# Patient Record
Sex: Female | Born: 2000 | Race: Black or African American | Hispanic: No | Marital: Single | Smoking: Never smoker
Health system: Southern US, Community
[De-identification: ages and names within clinical notes are randomized; demographics above are authoritative.]

## PROBLEM LIST (undated history)

## (undated) DIAGNOSIS — J302 Other seasonal allergic rhinitis: Secondary | ICD-10-CM

---
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
## 2021-06-04 ENCOUNTER — Emergency Department (HOSPITAL_COMMUNITY)
Admission: EM | Admit: 2021-06-04 | Discharge: 2021-06-04 | Disposition: A | Discharge status: Home / Self Care | Payer: Medicaid Other | Attending: Emergency Medicine | Admitting: Emergency Medicine

## 2021-06-04 ENCOUNTER — Encounter (HOSPITAL_COMMUNITY): Payer: Self-pay | Admitting: Emergency Medicine

## 2021-06-04 DIAGNOSIS — H60501 Unspecified acute noninfective otitis externa, right ear: Secondary | ICD-10-CM | POA: Insufficient documentation

## 2021-06-04 DIAGNOSIS — H9201 Otalgia, right ear: Secondary | ICD-10-CM | POA: Diagnosis present

## 2021-06-04 MED ORDER — CIPROFLOXACIN-DEXAMETHASONE 0.3-0.1 % OT SUSP
4.0000 [drp] | Freq: Once | OTIC | Status: AC
  Administered 2021-06-04: 4 [drp] via OTIC
  Filled 2021-06-04: qty 7.5

## 2021-06-04 NOTE — ED Triage Notes (Signed)
Pt reports R sided otalgia for the last few days. States that she has tried home remedies without success.  ?

## 2021-06-04 NOTE — ED Provider Notes (Signed)
?[facility]-EMERGENCY DEPT ?Provider Note ? ? ?CSN: [id number] ?Arrival date & time: 06/04/21  1941 ? ?  ? ?History ? ?Chief Complaint  ?Patient presents with  ? Otalgia  ?  R  ? ? ?Beverly Gutierrez is a 21 y.o. female. ? ?Patient presents to the emergency department for evaluation of right ear pain "for a few days".  Review of records show a history of otitis externa.  Patient states that the pain is located inside the ear.  She denies other associated infectious symptoms such as fevers, runny nose or congestion, cough, sore throat.  She denies putting anything into her ear.  She has used cool compresses and over-the-counter eardrops without improvement.  No drainage.  Hearing is muffled.  She states that she gets water in her ears when she washes her hair and denies other instances where she gets water in her ears. ? ? ?  ? ?Home Medications ?Prior to Admission medications   ?Medication Sig Start Date End Date Taking? Authorizing Provider  ?cetirizine (ZYRTEC ALLERGY) 10 MG tablet Take 1 tablet (10 mg total) by mouth daily. 05/19/20   Particia Nearing, PA-C  ?cetirizine (ZYRTEC) 10 MG tablet Take 10 mg by mouth at bedtime.    [provider]  ?fluticasone (FLONASE) 50 MCG/ACT nasal spray Place 1 spray into both nostrils daily. 05/19/20   Particia Nearing, PA-C  ?ibuprofen (ADVIL,MOTRIN) 100 MG/5ML suspension Take 18.5 mLs (370 mg total) by mouth every 6 (six) hours as needed for mild pain. 02/25/13   Marcellina Millin, MD  ?ibuprofen (CHILDRENS MOTRIN) 100 MG/5ML suspension Take 21.5 mLs (430 mg total) by mouth every 6 (six) hours as needed for fever. 03/06/14   Marcellina Millin, MD  ?ondansetron (ZOFRAN-ODT) 4 MG disintegrating tablet Take 1 tablet (4 mg total) by mouth every 8 (eight) hours as needed for nausea or vomiting. 03/29/14   Niel Hummer, MD  ?Phenylephrine-Bromphen-DM (DIMETAPP DM COLD/COUGH PO) Take 10 mLs by mouth every 6 (six) hours as needed (for cough).    [provider]  ?   ? ?Allergies    ?Patient has no known allergies.   ? ?Review of Systems   ?Review of Systems ? ?Physical Exam ?Updated Vital Signs ?BP 108/73 (BP Location: Left Arm)   Pulse 96   Temp 98.1 ?F (36.7 ?C) (Oral)   Resp 16   SpO2 99%  ?Physical Exam ?Vitals and nursing note reviewed.  ?Constitutional:   ?   Appearance: She is well-developed.  ?HENT:  ?   Head: Normocephalic and atraumatic.  ?   Jaw: No trismus.  ?   Right Ear: Decreased hearing noted. No drainage.  ?   Left Ear: Tympanic membrane, ear canal and external ear normal.  ?   Ears:  ?   Comments: Right ear canal is inflamed with significant debris and swelling of the canal.  TM is occluded.  Mild pain with pulling on the pinna. ? ?Left ear canal and TM are normal. ?   Nose: Nose normal. No mucosal edema or rhinorrhea.  ?   Mouth/Throat:  ?   Mouth: Mucous membranes are moist. Mucous membranes are not dry. No oral lesions.  ?   Pharynx: Uvula midline. No oropharyngeal exudate, posterior oropharyngeal erythema or uvula swelling.  ?   Tonsils: No tonsillar abscesses.  ?Eyes:  ?   General:     ?   Right eye: No discharge.     ?   Left eye: No  discharge.  ?   Conjunctiva/sclera: Conjunctivae normal.  ?Cardiovascular:  ?   Rate and Rhythm: Normal rate and regular rhythm.  ?   Heart sounds: Normal heart sounds.  ?Pulmonary:  ?   Effort: Pulmonary effort is normal. No respiratory distress.  ?   Breath sounds: Normal breath sounds. No wheezing or rales.  ?Abdominal:  ?   Palpations: Abdomen is soft.  ?   Tenderness: There is no abdominal tenderness.  ?Musculoskeletal:  ?   Cervical back: Normal range of motion and neck supple.  ?Lymphadenopathy:  ?   Cervical: No cervical adenopathy.  ?Skin: ?   General: Skin is warm and dry.  ?Neurological:  ?   Mental Status: She is alert.  ?Psychiatric:     ?   Mood and Affect: Mood normal.  ? ? ?ED Results / Procedures / Treatments   ?Labs ?(all labs ordered are listed, but only abnormal results are  displayed) ?Labs Reviewed - No data to display ? ?EKG ?None ? ?Radiology ?No results found. ? ?Procedures ?Procedures  ? ? ?Medications Ordered in ED ?Medications  ?ciprofloxacin-dexamethasone (CIPRODEX) 0.3-0.1 % OTIC (EAR) suspension 4 drop (has no administration in time range)  ? ? ?ED Course/ Medical Decision Making/ A&P ?  ? ?Patient seen and examined. History obtained directly from patient.  ? ?Medications/Fluids: Ordered: Ciprodex eardrops  ? ?Most recent vital signs reviewed and are as follows: ?BP 108/73 (BP Location: Left Arm)   Pulse 96   Temp 98.1 ?F (36.7 ?C) (Oral)   Resp 16   SpO2 99%  ? ?Initial impression: Right-sided otitis externa, no signs of malignant OE ? ?Home treatment plan: Ciprodex eardrops ? ?Return instructions discussed with patient: Fever, swelling or redness around the ear ? ?Follow-up instructions discussed with patient: Encouraged follow-up with PCP in 3 to 5 days if not improving ? ?                        ?Medical Decision Making ?Risk ?Prescription drug management. ? ? ?Patient with right-sided ear pain.  No other infectious symptoms.  TM is occluded.  There is clinical symptoms of otitis externa.  Patient will be treated accordingly.  Patient appears well nonseptic. ? ? ? ? ? ? ? ?Final Clinical Impression(s) / ED Diagnoses ?Final diagnoses:  ?Acute otitis externa of right ear, unspecified type  ? ? ?Rx / DC Orders ?ED Discharge Orders   ? ? None  ? ?  ? ? ?  ?Renne Crigler, PA-C ?06/04/21 0302 ? ?  ?Tilden Fossa, MD ?06/04/21 [phone] ? ?

## 2021-06-04 NOTE — Discharge Instructions (Signed)
Please read and follow all provided instructions. ? ?Your diagnoses today include:  ?1. Acute otitis externa of right ear, unspecified type   ? ? ?Tests performed today include: ?Vital signs. See below for your results today.  ? ?Medications prescribed:  ?Ciprodex ear drops - instill 4 drops into affected ear twice daily for 7 days ? ?Take any prescribed medications only as directed. ? ?Home care instructions:  ?Follow any educational materials contained in this packet. ? ?BE VERY CAREFUL not to take multiple medicines containing Tylenol (also called acetaminophen). Doing so can lead to an overdose which can damage your liver and cause liver failure and possibly death.  ? ?Follow-up instructions: ?Please follow-up with your primary care provider in the next 3 days for further evaluation of your symptoms.  ? ?Return instructions:  ?Please return to the Emergency Department if you experience worsening symptoms.  ?Please return if you have any other emergent concerns. ? ?Additional Information: ? ?Your vital signs today were: ?BP 108/73 (BP Location: Left Arm)   Pulse 96   Temp 98.1 ?F (36.7 ?C) (Oral)   Resp 16   SpO2 99%  ?If your blood pressure (BP) was elevated above 135/85 this visit, please have this repeated by your doctor within one month. ?-------------- ? ?

## 2021-12-30 ENCOUNTER — Ambulatory Visit (HOSPITAL_COMMUNITY)
Admission: EM | Admit: 2021-12-30 | Discharge: 2021-12-30 | Disposition: A | Discharge status: Home / Self Care | Payer: Medicaid Other | Attending: Family Medicine | Admitting: Family Medicine

## 2021-12-30 ENCOUNTER — Encounter (HOSPITAL_COMMUNITY): Payer: Self-pay | Admitting: *Deleted

## 2021-12-30 DIAGNOSIS — Z3202 Encounter for pregnancy test, result negative: Secondary | ICD-10-CM

## 2021-12-30 DIAGNOSIS — N12 Tubulo-interstitial nephritis, not specified as acute or chronic: Secondary | ICD-10-CM | POA: Insufficient documentation

## 2021-12-30 LAB — POCT URINALYSIS DIPSTICK, ED / UC
Bilirubin Urine: NEGATIVE
Glucose, UA: NEGATIVE mg/dL
Ketones, ur: 40 mg/dL — AB
Nitrite: POSITIVE — AB
Protein, ur: 100 mg/dL — AB
Specific Gravity, Urine: 1.015 (ref 1.005–1.030)
Urobilinogen, UA: 0.2 mg/dL (ref 0.0–1.0)
pH: 5.5 (ref 5.0–8.0)

## 2021-12-30 LAB — POC URINE PREG, ED: Preg Test, Ur: NEGATIVE

## 2021-12-30 MED ORDER — ONDANSETRON 4 MG PO TBDP: 4.0000 mg | ORAL_TABLET | Freq: Three times a day (TID) | ORAL | 0 refills | Status: DC | PRN

## 2021-12-30 MED ORDER — CIPROFLOXACIN HCL 500 MG PO TABS: 500.0000 mg | ORAL_TABLET | Freq: Two times a day (BID) | ORAL | 0 refills | Status: AC

## 2021-12-30 MED ORDER — ONDANSETRON 4 MG PO TBDP
4.0000 mg | ORAL_TABLET | Freq: Once | ORAL | Status: AC
  Administered 2021-12-30: 4 mg via ORAL

## 2021-12-30 MED ORDER — CEFTRIAXONE SODIUM 1 G IJ SOLR
1.0000 g | Freq: Once | INTRAMUSCULAR | Status: AC
  Administered 2021-12-30: 1 g via INTRAMUSCULAR

## 2021-12-30 MED ORDER — ONDANSETRON 4 MG PO TBDP
ORAL_TABLET | ORAL | Status: AC
  Filled 2021-12-30: qty 1

## 2021-12-30 MED ORDER — LIDOCAINE HCL (PF) 1 % IJ SOLN
INTRAMUSCULAR | Status: AC
  Filled 2021-12-30: qty 2

## 2021-12-30 NOTE — ED Provider Notes (Signed)
[place] [facility]    CSN: [id number] Arrival date & time: 12/30/21  3557      History   Chief Complaint Chief Complaint  Patient presents with   Dysuria   Back Pain   Emesis    HPI Beverly Gutierrez is a 21 y.o. female.    Dysuria Associated symptoms: vomiting   Back Pain Associated symptoms: dysuria   Emesis  Here for dysuria and urinary frequency that began October 1.  She began vomiting yesterday and has thrown up about 3 times in the last 24 hours.  Last bowel movement was normal and yesterday.  He is having some right-sided flank pain and some right upper quadrant pain also.  Last menstrual cycle was September 18th.  Past Medical History:  Diagnosis Date   Seasonal allergies     There are no problems to display for this patient.   History reviewed. No pertinent surgical history.  OB History   No obstetric history on file.      Home Medications    Prior to Admission medications   Medication Sig Start Date End Date Taking? Authorizing Provider  ciprofloxacin (CIPRO) 500 MG tablet Take 1 tablet (500 mg total) by mouth 2 (two) times daily for 7 days. 12/30/21 01/06/22 Yes Hjalmer Iovino, Gwenlyn Perking, MD  ondansetron (ZOFRAN-ODT) 4 MG disintegrating tablet Take 1 tablet (4 mg total) by mouth every 8 (eight) hours as needed for nausea or vomiting. 12/30/21  Yes Barrett Henle, MD  Marilu Favre 150-35 MCG/24HR transdermal patch 1 patch once a week. 12/14/21  Yes [provider]  cetirizine (ZYRTEC ALLERGY) 10 MG tablet Take 1 tablet (10 mg total) by mouth daily. 05/19/20   Volney American, PA-C  cetirizine (ZYRTEC) 10 MG tablet Take 10 mg by mouth at bedtime.    [provider]  fluticasone (FLONASE) 50 MCG/ACT nasal spray Place 1 spray into both nostrils daily. 05/19/20   Volney American, PA-C    Family History Family History  Problem Relation Age of Onset   Healthy Mother    Healthy Father     Social History Social History    Tobacco Use   Smoking status: Never    Passive exposure: Yes  Substance Use Topics   Alcohol use: Yes   Drug use: Yes    Types: Marijuana     Allergies   Patient has no known allergies.   Review of Systems Review of Systems  Gastrointestinal:  Positive for vomiting.  Genitourinary:  Positive for dysuria.  Musculoskeletal:  Positive for back pain.     Physical Exam Triage Vital Signs ED Triage Vitals  Enc Vitals Group     BP 12/30/21 0924 118/73     Pulse Rate 12/30/21 0924 79     Resp 12/30/21 0924 18     Temp 12/30/21 0924 98.4 F (36.9 C)     Temp Source 12/30/21 0924 Oral     SpO2 12/30/21 0924 98 %     Weight --      Height --      Head Circumference --      Peak Flow --      Pain Score 12/30/21 0922 9     Pain Loc --      Pain Edu? --      Excl. in [place]? --    No data found.  Updated Vital Signs BP 118/73 (BP Location: Right Arm)   Pulse 79   Temp 98.4 F (36.9 C) (Oral)  Resp 18   LMP 12/13/2021 (Exact Date)   SpO2 98%   Visual Acuity Right Eye Distance:   Left Eye Distance:   Bilateral Distance:    Right Eye Near:   Left Eye Near:    Bilateral Near:     Physical Exam Vitals reviewed.  Constitutional:      General: She is not in acute distress.    Appearance: She is not ill-appearing, toxic-appearing or diaphoretic.  HENT:     Mouth/Throat:     Mouth: Mucous membranes are moist.     Pharynx: No oropharyngeal exudate or posterior oropharyngeal erythema.  Eyes:     Extraocular Movements: Extraocular movements intact.     Conjunctiva/sclera: Conjunctivae normal.     Pupils: Pupils are equal, round, and reactive to light.  Cardiovascular:     Rate and Rhythm: Normal rate and regular rhythm.     Heart sounds: No murmur heard. Pulmonary:     Effort: Pulmonary effort is normal.     Breath sounds: Normal breath sounds.  Abdominal:     General: There is no distension.     Palpations: Abdomen is soft.     Comments: There is some  tenderness in the right upper quadrant.  Abdomen is soft.  She also has some right CVA tenderness  Musculoskeletal:     Cervical back: Neck supple.  Lymphadenopathy:     Cervical: No cervical adenopathy.  Skin:    Coloration: Skin is not jaundiced or pale.  Neurological:     General: No focal deficit present.     Mental Status: She is alert and oriented to person, place, and time.  Psychiatric:        Behavior: Behavior normal.      UC Treatments / Results  Labs (all labs ordered are listed, but only abnormal results are displayed) Labs Reviewed  POCT URINALYSIS DIPSTICK, ED / UC - Abnormal; Notable for the following components:      Result Value   Ketones, ur 40 (*)    Hgb urine dipstick MODERATE (*)    Protein, ur 100 (*)    Nitrite POSITIVE (*)    Leukocytes,Ua SMALL (*)    All other components within normal limits  URINE CULTURE  POC URINE PREG, ED    EKG   Radiology No results found.  Procedures Procedures (including critical care time)  Medications Ordered in UC Medications  cefTRIAXone (ROCEPHIN) injection 1 g (has no administration in time range)  ondansetron (ZOFRAN-ODT) disintegrating tablet 4 mg (has no administration in time range)    Initial Impression / Assessment and Plan / UC Course  I have reviewed the triage vital signs and the nursing notes.  Pertinent labs & imaging results that were available during my care of the patient were reviewed by me and considered in my medical decision making (see chart for details).       Urinalysis shows blood, white blood cells, and nitrite positive. UPT is  Her vital signs are reassuring, but I do feel that she most likely has developed a pyelonephritis.  We are going to give her an injection of Rocephin here and then prescribe Cipro to take for the next 7 days.  Also Zofran for the nausea Final Clinical Impressions(s) / UC Diagnoses   Final diagnoses:  Pyelonephritis     Discharge Instructions       The urine showed blood, white blood cells and nitrites.  This is indicative of a urinary infection.  I think  the infection has gone to your right kidney.  You are given a shot of ceftriaxone 1000 mg here in the office.  This is an antibiotic.  I would like you to start your oral antibiotic tonight or in the morning Take Cipro 500 mg--1 tablet 2 times daily for 7 days; this is the oral antibiotic  Ondansetron dissolved in the mouth every 8 hours as needed for nausea or vomiting. Clear liquids and bland things to eat.  If you are not keeping fluids down or your symptoms are worsening, please proceed to the emergency room.  Sometimes kidney infections need IV antibiotics   Pregnancy test was negative     ED Prescriptions     Medication Sig Dispense Auth. Provider   ciprofloxacin (CIPRO) 500 MG tablet Take 1 tablet (500 mg total) by mouth 2 (two) times daily for 7 days. 14 tablet Denise Washburn, Janace Aris, MD   ondansetron (ZOFRAN-ODT) 4 MG disintegrating tablet Take 1 tablet (4 mg total) by mouth every 8 (eight) hours as needed for nausea or vomiting. 10 tablet Marlinda Mike Janace Aris, MD      PDMP not reviewed this encounter.   Zenia Resides, MD 12/30/21 1012

## 2021-12-30 NOTE — ED Triage Notes (Signed)
Pt states she started with painful urination, cloudy and some blood in her urine Sunday. She has also developed lower back pain yesterday. She started vomiting yesterday as well. She is table IBU for sx.

## 2021-12-30 NOTE — Discharge Instructions (Addendum)
The urine showed blood, white blood cells and nitrites.  This is indicative of a urinary infection.  I think the infection has gone to your right kidney.  You are given a shot of ceftriaxone 1000 mg here in the office.  This is an antibiotic.  I would like you to start your oral antibiotic tonight or in the morning Take Cipro 500 mg--1 tablet 2 times daily for 7 days; this is the oral antibiotic  Ondansetron dissolved in the mouth every 8 hours as needed for nausea or vomiting. Clear liquids and bland things to eat.  If you are not keeping fluids down or your symptoms are worsening, please proceed to the emergency room.  Sometimes kidney infections need IV antibiotics   Pregnancy test was negative

## 2022-01-01 LAB — URINE CULTURE: Culture: >=100000 — AB

## 2022-02-24 ENCOUNTER — Encounter (HOSPITAL_COMMUNITY): Payer: Self-pay | Admitting: *Deleted

## 2022-02-24 ENCOUNTER — Ambulatory Visit (HOSPITAL_COMMUNITY)
Admission: EM | Admit: 2022-02-24 | Discharge: 2022-02-24 | Disposition: A | Discharge status: Home / Self Care | Payer: Medicaid Other | Attending: Internal Medicine | Admitting: Internal Medicine

## 2022-02-24 DIAGNOSIS — N898 Other specified noninflammatory disorders of vagina: Secondary | ICD-10-CM | POA: Insufficient documentation

## 2022-02-24 DIAGNOSIS — N3001 Acute cystitis with hematuria: Secondary | ICD-10-CM | POA: Insufficient documentation

## 2022-02-24 LAB — POCT URINALYSIS DIPSTICK, ED / UC
Bilirubin Urine: NEGATIVE
Glucose, UA: NEGATIVE mg/dL
Nitrite: NEGATIVE
Protein, ur: 100 mg/dL — AB
Specific Gravity, Urine: 1.025 (ref 1.005–1.030)
Urobilinogen, UA: 0.2 mg/dL (ref 0.0–1.0)
pH: 7 (ref 5.0–8.0)

## 2022-02-24 MED ORDER — CEPHALEXIN 500 MG PO CAPS: 500.0000 mg | ORAL_CAPSULE | Freq: Two times a day (BID) | ORAL | 0 refills | Status: AC

## 2022-02-24 NOTE — ED Triage Notes (Signed)
Pt states she has dysuria x 2 days. She had one back in October and just wanted to catch It soon this time. She is taking no meds.

## 2022-02-24 NOTE — ED Provider Notes (Signed)
[facility]    CSN: [id number] Arrival date & time: 02/24/22  7829      History   Chief Complaint Chief Complaint  Patient presents with   Dysuria    HPI Beverly Gutierrez is a 21 y.o. female.   Patient presents urgent care with 2-day history of dysuria, urinary frequency, and upper back discomfort.  She states upper back discomfort has since resolved but she does report history of urinary tract infection in October 2023 couple months ago and states that she would like to catch it early if she does have a UTI.  Last menstrual cycle was February 05, 2022.  Denies fever/chills, nausea, vomiting, abdominal pain, constipation, vaginal rash, and vaginal itching.  She is reporting new vaginal discharge that started a few days ago as well that she states is white and thin with a foul odor.  States she does like to drink a lot of juice and soda but attempts to drink as much water as she can.  Denies history of bacterial vaginitis.  No recent new sexual contacts or known exposure to STD.  No attempted use of any over-the-counter medications prior to arrival urgent care.     Dysuria   Past Medical History:  Diagnosis Date   Seasonal allergies     There are no problems to display for this patient.   History reviewed. No pertinent surgical history.  OB History   No obstetric history on file.      Home Medications    Prior to Admission medications   Medication Sig Start Date End Date Taking? Authorizing Provider  cephALEXin (KEFLEX) 500 MG capsule Take 1 capsule (500 mg total) by mouth 2 (two) times daily for 7 days. 02/24/22 03/03/22 Yes Jayon Matton, Donavan Burnet, FNP  cetirizine (ZYRTEC ALLERGY) 10 MG tablet Take 1 tablet (10 mg total) by mouth daily. 05/19/20   Particia Nearing, PA-C  cetirizine (ZYRTEC) 10 MG tablet Take 10 mg by mouth at bedtime.    [provider]  fluticasone (FLONASE) 50 MCG/ACT nasal spray Place 1 spray into both nostrils daily. 05/19/20    Particia Nearing, PA-C  ondansetron (ZOFRAN-ODT) 4 MG disintegrating tablet Take 1 tablet (4 mg total) by mouth every 8 (eight) hours as needed for nausea or vomiting. 12/30/21   Zenia Resides, MD  Burr Medico 150-35 MCG/24HR transdermal patch 1 patch once a week. 12/14/21   [provider]    Family History Family History  Problem Relation Age of Onset   Healthy Mother    Healthy Father     Social History Social History   Tobacco Use   Smoking status: Never    Passive exposure: Yes  Substance Use Topics   Alcohol use: Yes   Drug use: Yes    Types: Marijuana     Allergies   Patient has no known allergies.   Review of Systems Review of Systems  Genitourinary:  Positive for dysuria.  Per HPI   Physical Exam Triage Vital Signs ED Triage Vitals  Enc Vitals Group     BP 02/24/22 0852 (!) 110/57     Pulse Rate 02/24/22 0852 91     Resp 02/24/22 0852 18     Temp 02/24/22 0852 98.4 F (36.9 C)     Temp Source 02/24/22 0852 Oral     SpO2 02/24/22 0852 98 %     Weight --      Height --      Head Circumference --  Peak Flow --      Pain Score 02/24/22 0850 4     Pain Loc --      Pain Edu? --      Excl. in GC? --    No data found.  Updated Vital Signs BP (!) 110/57 (BP Location: Right Arm)   Pulse 91   Temp 98.4 F (36.9 C) (Oral)   Resp 18   LMP 02/05/2022 (Exact Date)   SpO2 98%   Visual Acuity Right Eye Distance:   Left Eye Distance:   Bilateral Distance:    Right Eye Near:   Left Eye Near:    Bilateral Near:     Physical Exam Vitals and nursing note reviewed.  Constitutional:      Appearance: She is not ill-appearing or toxic-appearing.  HENT:     Head: Normocephalic and atraumatic.     Right Ear: Hearing and external ear normal.     Left Ear: Hearing and external ear normal.     Nose: Nose normal.     Mouth/Throat:     Lips: Pink.     Mouth: Mucous membranes are moist.     Pharynx: No posterior oropharyngeal erythema.   Eyes:     General: Lids are normal. Vision grossly intact. Gaze aligned appropriately.     Extraocular Movements: Extraocular movements intact.     Conjunctiva/sclera: Conjunctivae normal.  Pulmonary:     Effort: Pulmonary effort is normal.  Abdominal:     General: Abdomen is flat. Bowel sounds are normal.     Palpations: Abdomen is soft.     Tenderness: There is no abdominal tenderness. There is no right CVA tenderness or left CVA tenderness.  Genitourinary:    Comments: Deferred. Musculoskeletal:     Cervical back: Neck supple.  Skin:    General: Skin is warm and dry.     Capillary Refill: Capillary refill takes less than 2 seconds.     Findings: No rash.  Neurological:     General: No focal deficit present.     Mental Status: She is alert and oriented to person, place, and time. Mental status is at baseline.     Cranial Nerves: No dysarthria or facial asymmetry.  Psychiatric:        Mood and Affect: Mood normal.        Speech: Speech normal.        Behavior: Behavior normal.        Thought Content: Thought content normal.        Judgment: Judgment normal.      UC Treatments / Results  Labs (all labs ordered are listed, but only abnormal results are displayed) Labs Reviewed  POCT URINALYSIS DIPSTICK, ED / UC - Abnormal; Notable for the following components:      Result Value   Ketones, ur TRACE (*)    Hgb urine dipstick TRACE (*)    Protein, ur 100 (*)    Leukocytes,Ua LARGE (*)    All other components within normal limits  URINE CULTURE  CERVICOVAGINAL ANCILLARY ONLY    EKG   Radiology No results found.  Procedures Procedures (including critical care time)  Medications Ordered in UC Medications - No data to display  Initial Impression / Assessment and Plan / UC Course  I have reviewed the triage vital signs and the nursing notes.  Pertinent labs & imaging results that were available during my care of the patient were reviewed by me and considered in  my medical  decision making (see chart for details).   1.  Acute cystitis with hematuria Urinalysis shows large leukocytes, trace hemoglobin, some ketones, and some protein indicating likely urinary tract infection.  Urine culture is pending.  We will call patient with results if necessary and if they change treatment plan.  Keflex twice daily for the next 7 days sent to pharmacy.  Patient denies allergies to antibiotics or recent antibiotic use within the last month.  Advised to increase water intake to at least 64 ounces of water per day in general and while recovering from UTI.  Discussed avoiding urinary irritants as this can contribute to frequent urinary tract infections.   2.  STD screening  STI labs pending.  Patient declines HIV and syphilis testing today.  Will notify patient of positive results and treat accordingly when labs come back.  Patient to avoid sexual intercourse until screening testing comes back.  Education provided regarding safe sexual practices and patient encouraged to use protection to prevent spread of STIs.   Discussed physical exam and available lab work findings in clinic with patient.  Counseled patient regarding appropriate use of medications and potential side effects for all medications recommended or prescribed today. Discussed red flag signs and symptoms of worsening condition,when to call the PCP office, return to urgent care, and when to seek higher level of care in the emergency department. Patient verbalizes understanding and agreement with plan. All questions answered. Patient discharged in stable condition.    Final Clinical Impressions(s) / UC Diagnoses   Final diagnoses:  Acute cystitis with hematuria  Vaginal discharge     Discharge Instructions      You have been diagnosed with urinary tract infection today.  I have prescribed Keflex for you to take 500 mg twice daily for the next 7 days.  If you develop diarrhea while taking this medication you  may purchase an over-the-counter probiotic or eat yogurt with live active cultures.  To avoid GI upset please take this medication with food. I have sent your urine for culture to see what type of bacteria grows. We will call you if we need to change the treatment plan based on the results of your urine culture.  We sent your vaginal swab off for testing, this will come back in the next 2-3 days. We will call you with the results if you require further treatment.  You will not hear from [place] if everything is negative.  If you develop any new or worsening symptoms or do not improve in the next 2 to 3 days, please return.  If your symptoms are severe, please go to the emergency room.  Follow-up with your primary care provider for further evaluation and management of your symptoms as well as ongoing wellness visits.  I hope you feel better!     ED Prescriptions     Medication Sig Dispense Auth. Provider   cephALEXin (KEFLEX) 500 MG capsule Take 1 capsule (500 mg total) by mouth 2 (two) times daily for 7 days. 14 capsule Carlisle Beers, FNP      PDMP not reviewed this encounter.   Reita May Zalma, [place] 02/24/22 [phone]

## 2022-02-24 NOTE — Discharge Instructions (Signed)
You have been diagnosed with urinary tract infection today.  I have prescribed Keflex for you to take 500 mg twice daily for the next 7 days.  If you develop diarrhea while taking this medication you may purchase an over-the-counter probiotic or eat yogurt with live active cultures.  To avoid GI upset please take this medication with food. I have sent your urine for culture to see what type of bacteria grows. We will call you if we need to change the treatment plan based on the results of your urine culture.  We sent your vaginal swab off for testing, this will come back in the next 2-3 days. We will call you with the results if you require further treatment.  You will not hear from [place] if everything is negative.  If you develop any new or worsening symptoms or do not improve in the next 2 to 3 days, please return.  If your symptoms are severe, please go to the emergency room.  Follow-up with your primary care provider for further evaluation and management of your symptoms as well as ongoing wellness visits.  I hope you feel better!

## 2022-02-25 LAB — CERVICOVAGINAL ANCILLARY ONLY
Bacterial Vaginitis (gardnerella): POSITIVE — AB
Candida Glabrata: NEGATIVE
Candida Vaginitis: NEGATIVE
Chlamydia: NEGATIVE
Comment: NEGATIVE
Comment: NEGATIVE
Comment: NEGATIVE
Comment: NEGATIVE
Comment: NEGATIVE
Comment: NORMAL
Neisseria Gonorrhea: NEGATIVE
Trichomonas: NEGATIVE

## 2022-02-26 LAB — URINE CULTURE: Culture: >=100000 — AB

## 2022-02-27 ENCOUNTER — Telehealth: Payer: Medicaid Other | Admitting: Family

## 2022-02-27 DIAGNOSIS — B3731 Acute candidiasis of vulva and vagina: Secondary | ICD-10-CM | POA: Diagnosis not present

## 2022-02-27 MED ORDER — FLUCONAZOLE 150 MG PO TABS: 150.0000 mg | ORAL_TABLET | ORAL | 0 refills | Status: DC | PRN

## 2022-02-27 NOTE — Progress Notes (Signed)

## 2022-02-28 ENCOUNTER — Telehealth: Payer: Medicaid Other | Admitting: Physician Assistant

## 2022-02-28 DIAGNOSIS — N760 Acute vaginitis: Secondary | ICD-10-CM | POA: Diagnosis not present

## 2022-02-28 DIAGNOSIS — B9689 Other specified bacterial agents as the cause of diseases classified elsewhere: Secondary | ICD-10-CM | POA: Diagnosis not present

## 2022-02-28 MED ORDER — METRONIDAZOLE 500 MG PO TABS: 500.0000 mg | ORAL_TABLET | Freq: Two times a day (BID) | ORAL | 0 refills | Status: AC

## 2022-02-28 NOTE — Progress Notes (Signed)
E-Visit for Vaginal Symptoms  We are sorry that you are not feeling well. Here is how we plan to help! Based on what you shared with me it looks like you: May have a vaginosis due to bacteria  Vaginosis is an inflammation of the vagina that can result in discharge, itching and pain. The cause is usually a change in the normal balance of vaginal bacteria or an infection. Vaginosis can also result from reduced estrogen levels after menopause.  The most common causes of vaginosis are:   Bacterial vaginosis which results from an overgrowth of one on several organisms that are normally present in your vagina.   Yeast infections which are caused by a naturally occurring fungus called candida.   Vaginal atrophy (atrophic vaginosis) which results from the thinning of the vagina from reduced estrogen levels after menopause.   Trichomoniasis which is caused by a parasite and is commonly transmitted by sexual intercourse.  Factors that increase your risk of developing vaginosis include: Medications, such as antibiotics and steroids Uncontrolled diabetes Use of hygiene products such as bubble bath, vaginal spray or vaginal deodorant Douching Wearing damp or tight-fitting clothing Using an intrauterine device (IUD) for birth control Hormonal changes, such as those associated with pregnancy, birth control pills or menopause Sexual activity Having a sexually transmitted infection  Your treatment plan is Metronidazole or Flagyl 500mg twice a day for 7 days.  I have electronically sent this prescription into the pharmacy that you have chosen.  Be sure to take all of the medication as directed. Stop taking any medication if you develop a rash, tongue swelling or shortness of breath. Mothers who are breast feeding should consider pumping and discarding their breast milk while on these antibiotics. However, there is no consensus that infant exposure at these doses would be harmful.  Remember that  medication creams can weaken latex condoms. .   HOME CARE:  Good hygiene may prevent some types of vaginosis from recurring and may relieve some symptoms:  Avoid baths, hot tubs and whirlpool spas. Rinse soap from your outer genital area after a shower, and dry the area well to prevent irritation. Don't use scented or harsh soaps, such as those with deodorant or antibacterial action. Avoid irritants. These include scented tampons and pads. Wipe from front to back after using the toilet. Doing so avoids spreading fecal bacteria to your vagina.  Other things that may help prevent vaginosis include:  Don't douche. Your vagina doesn't require cleansing other than normal bathing. Repetitive douching disrupts the normal organisms that reside in the vagina and can actually increase your risk of vaginal infection. Douching won't clear up a vaginal infection. Use a latex condom. Both female and female latex condoms may help you avoid infections spread by sexual contact. Wear cotton underwear. Also wear pantyhose with a cotton crotch. If you feel comfortable without it, skip wearing underwear to bed. Yeast thrives in moist environments Your symptoms should improve in the next day or two.  GET HELP RIGHT AWAY IF:  You have pain in your lower abdomen ( pelvic area or over your ovaries) You develop nausea or vomiting You develop a fever Your discharge changes or worsens You have persistent pain with intercourse You develop shortness of breath, a rapid pulse, or you faint.  These symptoms could be signs of problems or infections that need to be evaluated by a medical provider now.  MAKE SURE YOU   Understand these instructions. Will watch your condition. Will get help right   away if you are not doing well or get worse.  Thank you for choosing an e-visit.  Your e-visit answers were reviewed by a board certified advanced clinical practitioner to complete your personal care plan. Depending upon the  condition, your plan could have included both over the counter or prescription medications.  Please review your pharmacy choice. Make sure the pharmacy is open so you can pick up prescription now. If there is a problem, you may contact your provider through MyChart messaging and have the prescription routed to another pharmacy.  Your safety is important to us. If you have drug allergies check your prescription carefully.   For the next 24 hours you can use MyChart to ask questions about today's visit, request a non-urgent call back, or ask for a work or school excuse. You will get an email in the next two days asking about your experience. I hope that your e-visit has been valuable and will speed your recovery.  I have spent 5 minutes in review of e-visit questionnaire, review and updating patient chart, medical decision making and response to patient.   Kaelan Amble M Jakelin Taussig, PA-C  

## 2022-06-29 ENCOUNTER — Telehealth: Payer: Medicaid Other | Admitting: Physician Assistant

## 2022-06-29 DIAGNOSIS — B9689 Other specified bacterial agents as the cause of diseases classified elsewhere: Secondary | ICD-10-CM

## 2022-06-29 DIAGNOSIS — N760 Acute vaginitis: Secondary | ICD-10-CM | POA: Diagnosis not present

## 2022-06-29 MED ORDER — METRONIDAZOLE 500 MG PO TABS: 500.0000 mg | ORAL_TABLET | Freq: Two times a day (BID) | ORAL | 0 refills | Status: AC

## 2022-06-29 NOTE — Progress Notes (Signed)
E-Visit for Vaginal Symptoms  We are sorry that you are not feeling well. Here is how we plan to help! Based on what you shared with me it looks like you: May have a vaginosis due to bacteria  Vaginosis is an inflammation of the vagina that can result in discharge, itching and pain. The cause is usually a change in the normal balance of vaginal bacteria or an infection. Vaginosis can also result from reduced estrogen levels after menopause.  The most common causes of vaginosis are:   Bacterial vaginosis which results from an overgrowth of one on several organisms that are normally present in your vagina.   Yeast infections which are caused by a naturally occurring fungus called candida.   Vaginal atrophy (atrophic vaginosis) which results from the thinning of the vagina from reduced estrogen levels after menopause.   Trichomoniasis which is caused by a parasite and is commonly transmitted by sexual intercourse.  Factors that increase your risk of developing vaginosis include: Medications, such as antibiotics and steroids Uncontrolled diabetes Use of hygiene products such as bubble bath, vaginal spray or vaginal deodorant Douching Wearing damp or tight-fitting clothing Using an intrauterine device (IUD) for birth control Hormonal changes, such as those associated with pregnancy, birth control pills or menopause Sexual activity Having a sexually transmitted infection  Your treatment plan is Metronidazole or Flagyl 500mg twice a day for 7 days.  I have electronically sent this prescription into the pharmacy that you have chosen.  Be sure to take all of the medication as directed. Stop taking any medication if you develop a rash, tongue swelling or shortness of breath. Mothers who are breast feeding should consider pumping and discarding their breast milk while on these antibiotics. However, there is no consensus that infant exposure at these doses would be harmful.  Remember that  medication creams can weaken latex condoms. .   HOME CARE:  Good hygiene may prevent some types of vaginosis from recurring and may relieve some symptoms:  Avoid baths, hot tubs and whirlpool spas. Rinse soap from your outer genital area after a shower, and dry the area well to prevent irritation. Don't use scented or harsh soaps, such as those with deodorant or antibacterial action. Avoid irritants. These include scented tampons and pads. Wipe from front to back after using the toilet. Doing so avoids spreading fecal bacteria to your vagina.  Other things that may help prevent vaginosis include:  Don't douche. Your vagina doesn't require cleansing other than normal bathing. Repetitive douching disrupts the normal organisms that reside in the vagina and can actually increase your risk of vaginal infection. Douching won't clear up a vaginal infection. Use a latex condom. Both female and female latex condoms may help you avoid infections spread by sexual contact. Wear cotton underwear. Also wear pantyhose with a cotton crotch. If you feel comfortable without it, skip wearing underwear to bed. Yeast thrives in moist environments Your symptoms should improve in the next day or two.  GET HELP RIGHT AWAY IF:  You have pain in your lower abdomen ( pelvic area or over your ovaries) You develop nausea or vomiting You develop a fever Your discharge changes or worsens You have persistent pain with intercourse You develop shortness of breath, a rapid pulse, or you faint.  These symptoms could be signs of problems or infections that need to be evaluated by a medical provider now.  MAKE SURE YOU   Understand these instructions. Will watch your condition. Will get help right   away if you are not doing well or get worse.  Thank you for choosing an e-visit.  Your e-visit answers were reviewed by a board certified advanced clinical practitioner to complete your personal care plan. Depending upon the  condition, your plan could have included both over the counter or prescription medications.  Please review your pharmacy choice. Make sure the pharmacy is open so you can pick up prescription now. If there is a problem, you may contact your provider through MyChart messaging and have the prescription routed to another pharmacy.  Your safety is important to us. If you have drug allergies check your prescription carefully.   For the next 24 hours you can use MyChart to ask questions about today's visit, request a non-urgent call back, or ask for a work or school excuse. You will get an email in the next two days asking about your experience. I hope that your e-visit has been valuable and will speed your recovery.  I have spent 5 minutes in review of e-visit questionnaire, review and updating patient chart, medical decision making and response to patient.   Krithika Tome M Jaquari Reckner, PA-C  

## 2022-08-08 ENCOUNTER — Telehealth: Payer: Medicaid Other | Admitting: Nurse Practitioner

## 2022-08-08 DIAGNOSIS — H60501 Unspecified acute noninfective otitis externa, right ear: Secondary | ICD-10-CM

## 2022-08-09 MED ORDER — CIPROFLOXACIN-DEXAMETHASONE 0.3-0.1 % OT SUSP: 4.0000 [drp] | Freq: Two times a day (BID) | OTIC | 0 refills | Status: AC

## 2022-08-09 NOTE — Progress Notes (Signed)
E Visit for Ear Pain - Swimmer's Ear  We are sorry that you are not feeling well. Here is how we plan to help!  Based on what you have shared with me it looks like you have Swimmer's Ear.  Swimmer's ear is a redness or swelling, irritation, or infection of your outer ear canal. These symptoms usually occur within a few days of swimming. Your ear canal is a tube that goes from the opening of the ear to the eardrum.  When water stays in your ear canal, germs can grow.  This is a painful condition that often happens to children and swimmers of all ages.  It is not contagious and oral antibiotics are not required to treat uncomplicated swimmer's ear.  The usual symptoms include:    Itchiness inside the ear  Redness or a sense of swelling in the ear  Pain when the ear is tugged on when pressure is placed on the ear  Pus draining from the infected ear   I have prescribed: Meds ordered this encounter  Medications   ciprofloxacin-dexamethasone (CIPRODEX) OTIC suspension    Sig: Place 4 drops into the right ear 2 (two) times daily for 7 days.    Dispense:  7.5 mL    Refill:  0     In certain cases, swimmer's ear may progress to a more serious bacterial infection of the middle or inner ear.  If you have a fever 102 and up and significantly worsening symptoms, this could indicate a more serious infection moving to the middle/inner and needs face to face evaluation in an office by a provider.  Your symptoms should improve over the next 3 days and should resolve in about 7 days.  Be sure to complete ALL of your prescription.  HOME CARE: Wash your hands frequently. If you are prescribed an ear drop, do not place the tip of the bottle on your ear or touch it with your fingers. You can take Acetaminophen 650 mg every 4-6 hours as needed for pain.  If pain is severe or moderate, you can apply a heating pad (set on low) or hot water bottle (wrapped in a towel) to outer ear for 20 minutes.  This will also  increase drainage. Avoid ear plugs Do not go swimming until the symptoms are gone Do not use Q-tips After showers, help the water run out by tilting your head to one side.   GET HELP RIGHT AWAY IF: Fever is over 102.2 degrees. You develop progressive ear pain or hearing loss. Ear symptoms persist longer than 3 days after treatment.  MAKE SURE YOU: Understand these instructions. Will watch your condition. Will get help right away if you are not doing well or get worse.  TO PREVENT SWIMMER'S EAR: Use a bathing cap or custom fitted swim molds to keep your ears dry. Towel off after swimming to dry your ears. Tilt your head or pull your earlobes to allow the water to escape your ear canal. If there is still water in your ears, consider using a hairdryer on the lowest setting.  Thank you for choosing an e-visit.  Your e-visit answers were reviewed by a board certified advanced clinical practitioner to complete your personal care plan. Depending upon the condition, your plan could have included both over the counter or prescription medications.  Please review your pharmacy choice. Make sure the pharmacy is open so you can pick up the prescription now. If there is a problem, you may contact your provider  through [employer] and have the prescription routed to another pharmacy.  Your safety is important to [place]. If you have drug allergies check your prescription carefully.   For the next 24 hours you can use MyChart to ask questions about today's visit, request a non-urgent call back, or ask for a work or school excuse. You will get an email with a survey after your eVisit asking about your experience. We would appreciate your feedback. I hope that your e-visit has been valuable and will aid in your recovery.   I spent approximately 5 minutes reviewing the patient's history, current symptoms and coordinating their care today.

## 2022-08-11 ENCOUNTER — Telehealth: Payer: Medicaid Other | Admitting: Physician Assistant

## 2022-08-11 DIAGNOSIS — N760 Acute vaginitis: Secondary | ICD-10-CM

## 2022-08-11 DIAGNOSIS — N3000 Acute cystitis without hematuria: Secondary | ICD-10-CM

## 2022-08-11 MED ORDER — METRONIDAZOLE 500 MG PO TABS: 500.0000 mg | ORAL_TABLET | Freq: Two times a day (BID) | ORAL | 0 refills | Status: AC

## 2022-08-11 MED ORDER — NITROFURANTOIN MONOHYD MACRO 100 MG PO CAPS: 100.0000 mg | ORAL_CAPSULE | Freq: Two times a day (BID) | ORAL | 0 refills | Status: AC

## 2022-08-11 NOTE — Addendum Note (Signed)
Addended by: Christeen Douglas Z on: 08/11/2022 05:34 PM   Modules accepted: Orders

## 2022-08-11 NOTE — Progress Notes (Signed)
E-Visit for Vaginal Symptoms  We are sorry that you are not feeling well. Here is how we plan to help! Based on what you shared with me it looks like you: May have a vaginosis due to bacteria  Vaginosis is an inflammation of the vagina that can result in discharge, itching and pain. The cause is usually a change in the normal balance of vaginal bacteria or an infection. Vaginosis can also result from reduced estrogen levels after menopause.  The most common causes of vaginosis are:   Bacterial vaginosis which results from an overgrowth of one on several organisms that are normally present in your vagina.   Yeast infections which are caused by a naturally occurring fungus called candida.   Vaginal atrophy (atrophic vaginosis) which results from the thinning of the vagina from reduced estrogen levels after menopause.   Trichomoniasis which is caused by a parasite and is commonly transmitted by sexual intercourse.  Factors that increase your risk of developing vaginosis include: Medications, such as antibiotics and steroids Uncontrolled diabetes Use of hygiene products such as bubble bath, vaginal spray or vaginal deodorant Douching Wearing damp or tight-fitting clothing Using an intrauterine device (IUD) for birth control Hormonal changes, such as those associated with pregnancy, birth control pills or menopause Sexual activity Having a sexually transmitted infection  Your treatment plan is Metronidazole or Flagyl 500mg twice a day for 7 days.  I have electronically sent this prescription into the pharmacy that you have chosen.  Be sure to take all of the medication as directed. Stop taking any medication if you develop a rash, tongue swelling or shortness of breath. Mothers who are breast feeding should consider pumping and discarding their breast milk while on these antibiotics. However, there is no consensus that infant exposure at these doses would be harmful.  Remember that  medication creams can weaken latex condoms. .   HOME CARE:  Good hygiene may prevent some types of vaginosis from recurring and may relieve some symptoms:  Avoid baths, hot tubs and whirlpool spas. Rinse soap from your outer genital area after a shower, and dry the area well to prevent irritation. Don't use scented or harsh soaps, such as those with deodorant or antibacterial action. Avoid irritants. These include scented tampons and pads. Wipe from front to back after using the toilet. Doing so avoids spreading fecal bacteria to your vagina.  Other things that may help prevent vaginosis include:  Don't douche. Your vagina doesn't require cleansing other than normal bathing. Repetitive douching disrupts the normal organisms that reside in the vagina and can actually increase your risk of vaginal infection. Douching won't clear up a vaginal infection. Use a latex condom. Both female and female latex condoms may help you avoid infections spread by sexual contact. Wear cotton underwear. Also wear pantyhose with a cotton crotch. If you feel comfortable without it, skip wearing underwear to bed. Yeast thrives in moist environments Your symptoms should improve in the next day or two.  GET HELP RIGHT AWAY IF:  You have pain in your lower abdomen ( pelvic area or over your ovaries) You develop nausea or vomiting You develop a fever Your discharge changes or worsens You have persistent pain with intercourse You develop shortness of breath, a rapid pulse, or you faint.  These symptoms could be signs of problems or infections that need to be evaluated by a medical provider now.  MAKE SURE YOU   Understand these instructions. Will watch your condition. Will get help right   away if you are not doing well or get worse.  Thank you for choosing an e-visit.  Your e-visit answers were reviewed by a board certified advanced clinical practitioner to complete your personal care plan. Depending upon the  condition, your plan could have included both over the counter or prescription medications.  Please review your pharmacy choice. Make sure the pharmacy is open so you can pick up prescription now. If there is a problem, you may contact your provider through MyChart messaging and have the prescription routed to another pharmacy.  Your safety is important to us. If you have drug allergies check your prescription carefully.   For the next 24 hours you can use MyChart to ask questions about today's visit, request a non-urgent call back, or ask for a work or school excuse. You will get an email in the next two days asking about your experience. I hope that your e-visit has been valuable and will speed your recovery.   I have spent 5 minutes in review of e-visit questionnaire, review and updating patient chart, medical decision making and response to patient.   Perle Gibbon Z Ward, PA-C    

## 2022-11-28 ENCOUNTER — Telehealth: Payer: Medicaid Other | Admitting: Physician Assistant

## 2022-11-28 DIAGNOSIS — R21 Rash and other nonspecific skin eruption: Secondary | ICD-10-CM

## 2022-11-29 ENCOUNTER — Telehealth: Payer: Medicaid Other | Admitting: Physician Assistant

## 2022-11-29 DIAGNOSIS — N898 Other specified noninflammatory disorders of vagina: Secondary | ICD-10-CM

## 2022-11-29 MED ORDER — PREDNISONE 10 MG (21) PO TBPK: ORAL_TABLET | ORAL | 0 refills | Status: AC

## 2022-11-29 NOTE — Progress Notes (Signed)
Message sent to patient requesting further input regarding current symptoms. Awaiting patient response.  

## 2022-11-29 NOTE — Progress Notes (Signed)
Because this is likely related to your recent depo injection, I feel your condition warrants further evaluation and I recommend that you be seen for a face to face visit.  This is likely some mild spotting from hormonal changes but you should reach out to your regular provider.  Please contact your primary care physician practice to be seen. Many offices offer virtual options to be seen via video if you are not comfortable going in person to a medical facility at this time.  NOTE: You will NOT be charged for this eVisit.  If you do not have a PCP, [facility] offers a free physician referral service available at [phone]. Our trained staff has the experience, knowledge and resources to put you in touch with a physician who is right for you.    If you are having a true medical emergency please call 911.   Your e-visit answers were reviewed by a board certified advanced clinical practitioner to complete your personal care plan.  Thank you for using e-Visits.

## 2022-11-29 NOTE — Progress Notes (Signed)
E Visit for Rash  We are sorry that you are not feeling well. Here is how we plan to help!  Based on what you have shared with me and the pictures, this looks like a mild case of heat rash/photodermatitis. Keep skin clean and dry. Cool compresses may be beneficial. I have prescribed a short course of prednisone to take as directed to resolve rash and itching.    HOME CARE:  Take cool showers and avoid direct sunlight. Apply cool compress or wet dressings. Take a bath in an oatmeal bath.  Sprinkle content of one Aveeno packet under running faucet with comfortably warm water.  Bathe for 15-20 minutes, 1-2 times daily.  Pat dry with a towel. Do not rub the rash. Use hydrocortisone cream. Take an antihistamine like Benadryl for widespread rashes that itch.  The adult dose of Benadryl is 25-50 mg by mouth 4 times daily. Caution:  This type of medication may cause sleepiness.  Do not drink alcohol, drive, or operate dangerous machinery while taking antihistamines.  Do not take these medications if you have prostate enlargement.  Read package instructions thoroughly on all medications that you take.  GET HELP RIGHT AWAY IF:  Symptoms don't go away after treatment. Severe itching that persists. If you rash spreads or swells. If you rash begins to smell. If it blisters and opens or develops a yellow-brown crust. You develop a fever. You have a sore throat. You become short of breath.  MAKE SURE YOU:  Understand these instructions. Will watch your condition. Will get help right away if you are not doing well or get worse.  Thank you for choosing an e-visit.  Your e-visit answers were reviewed by a board certified advanced clinical practitioner to complete your personal care plan. Depending upon the condition, your plan could have included both over the counter or prescription medications.  Please review your pharmacy choice. Make sure the pharmacy is open so you can pick up prescription  now. If there is a problem, you may contact your provider through [employer] and have the prescription routed to another pharmacy.  Your safety is important to [place]. If you have drug allergies check your prescription carefully.   For the next 24 hours you can use MyChart to ask questions about today's visit, request a non-urgent call back, or ask for a work or school excuse. You will get an email in the next two days asking about your experience. I hope that your e-visit has been valuable and will speed your recovery.

## 2022-11-29 NOTE — Progress Notes (Signed)
I have spent 5 minutes in review of e-visit questionnaire, review and updating patient chart, medical decision making and response to patient.   William Cody Martin, PA-C    

## 2022-12-14 ENCOUNTER — Telehealth: Payer: Medicaid Other | Admitting: Physician Assistant

## 2022-12-14 DIAGNOSIS — B9689 Other specified bacterial agents as the cause of diseases classified elsewhere: Secondary | ICD-10-CM | POA: Diagnosis not present

## 2022-12-14 DIAGNOSIS — N760 Acute vaginitis: Secondary | ICD-10-CM

## 2022-12-14 MED ORDER — METRONIDAZOLE 500 MG PO TABS: 500.0000 mg | ORAL_TABLET | Freq: Two times a day (BID) | ORAL | 0 refills | Status: AC

## 2022-12-14 NOTE — Progress Notes (Signed)
I have spent 5 minutes in review of e-visit questionnaire, review and updating patient chart, medical decision making and response to patient.   Mia Milan Cody Jacklynn Dehaas, PA-C    

## 2022-12-14 NOTE — Progress Notes (Signed)

## 2023-01-06 ENCOUNTER — Telehealth: Payer: Medicaid Other | Admitting: Emergency Medicine

## 2023-01-06 DIAGNOSIS — N898 Other specified noninflammatory disorders of vagina: Secondary | ICD-10-CM

## 2023-01-06 NOTE — Progress Notes (Signed)
Because your discharge is an unusual color, I feel your condition warrants further evaluation and I recommend that you be seen in a face to face visit.   NOTE: There will be NO CHARGE for this eVisit   If you are having a true medical emergency please call 911.      For an urgent face to face visit, [facility] has eight urgent care centers for your convenience:   NEW!! [facility] Center at [facility] Get Driving Directions [phone] [address], [address] [place], [address]    [facility] Health Urgent Care Center at [facility] Get Driving Directions [phone] [address] [place], [place] [address]   [facility] Health Urgent Care Center [facility]) Get Driving Directions [phone] [address] [place], [place] [address]  [facility] Health Urgent Care Center [facility]) Get Driving Directions [phone] [address] [place],  [place]  [address]  [facility] Health Urgent Care Center [facility] - at [employer]  [phone] [place],  [place] [address]   [facility] Health Urgent Care at [facility] Get Driving Directions [phone] [address] [place], [place] [address]   [facility] Health Urgent Care at [facility] Get Driving Directions  [phone] [address] [place], [place] [address]   [facility] Health Urgent Care at [facility] Directions [phone] [address], [address] [place], [place] [address]  Your MyChart E-visit questionnaire answers were reviewed by a board certified advanced clinical practitioner to complete your personal care plan based on your specific symptoms.  Thank you for using e-Visits.

## 2023-10-21 ENCOUNTER — Telehealth: Admitting: Family Medicine

## 2023-10-21 DIAGNOSIS — N3001 Acute cystitis with hematuria: Secondary | ICD-10-CM

## 2023-10-21 MED ORDER — CEPHALEXIN 500 MG PO CAPS: 500.0000 mg | ORAL_CAPSULE | Freq: Two times a day (BID) | ORAL | 0 refills | Status: AC

## 2023-10-21 NOTE — Progress Notes (Signed)

## 2023-10-26 ENCOUNTER — Telehealth: Admitting: Physician Assistant

## 2023-10-26 DIAGNOSIS — B379 Candidiasis, unspecified: Secondary | ICD-10-CM

## 2023-10-26 MED ORDER — FLUCONAZOLE 150 MG PO TABS: ORAL_TABLET | ORAL | 0 refills | Status: AC

## 2023-10-26 NOTE — Progress Notes (Signed)

## 2023-10-26 NOTE — Progress Notes (Signed)
 I have spent 5 minutes in review of e-visit questionnaire, review and updating patient chart, medical decision making and response to patient.   Piedad Climes, PA-C

## 2024-04-11 ENCOUNTER — Encounter: Payer: Self-pay | Admitting: Physician Assistant

## 2024-05-29 ENCOUNTER — Encounter: Admitting: Obstetrics & Gynecology
